# Patient Record
Sex: Male | Born: 2005 | Race: White | Hispanic: No | Marital: Single | Smoking: Never smoker
Health system: Southern US, Community
[De-identification: ages and names within clinical notes are randomized; demographics above are authoritative.]

## PROBLEM LIST (undated history)

## (undated) DIAGNOSIS — Z8489 Family history of other specified conditions: Secondary | ICD-10-CM

---
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
## 2024-01-18 ENCOUNTER — Encounter (HOSPITAL_COMMUNITY): Payer: Self-pay

## 2024-01-18 ENCOUNTER — Ambulatory Visit (HOSPITAL_COMMUNITY)
Admission: RE | Admit: 2024-01-18 | Discharge: 2024-01-18 | Disposition: A | Discharge status: Home / Self Care | Source: Ambulatory Visit | Attending: Neurosurgery | Admitting: Neurosurgery

## 2024-01-18 DIAGNOSIS — S32010A Wedge compression fracture of first lumbar vertebra, initial encounter for closed fracture: Secondary | ICD-10-CM | POA: Insufficient documentation

## 2024-01-18 MED ORDER — GADOBUTROL 1 MMOL/ML IV SOLN
7.0000 mL | Freq: Once | INTRAVENOUS | Status: AC | PRN
  Administered 2024-01-18: 7 mL via INTRAVENOUS
